# Patient Record
Sex: Female | Born: 1995
Health system: Southern US, Community
[De-identification: ages and names within clinical notes are randomized; demographics above are authoritative.]

---
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
## 2019-07-29 ENCOUNTER — Ambulatory Visit: Payer: Self-pay | Attending: Family

## 2019-07-29 DIAGNOSIS — Z23 Encounter for immunization: Secondary | ICD-10-CM

## 2019-07-29 NOTE — Progress Notes (Signed)
   Covid-19 Vaccination [facility]  Name:  Vanessa Green    MRN: [id number] DOB: 02-25-1996  07/29/2019  [name] was observed post Covid-19 immunization for 15 minutes without incident. She was provided with Vaccine Information Sheet and instruction to access the V-Safe system.   [name] was instructed to call 911 with any severe reactions post vaccine: Marland Kitchen Difficulty breathing  . Swelling of face and throat  . A fast heartbeat  . A bad rash all over body  . Dizziness and weakness   Immunizations Administered    Name Date Dose VIS Date Route   Moderna COVID-19 Vaccine 07/29/2019  1:12 PM 0.5 mL 04/13/2019 Intramuscular   Manufacturer: Moderna   Lot: [id number]   NDC: 10301-314-38

## 2019-08-31 ENCOUNTER — Ambulatory Visit: Payer: Self-pay | Attending: Family

## 2019-08-31 DIAGNOSIS — Z23 Encounter for immunization: Secondary | ICD-10-CM

## 2019-08-31 NOTE — Progress Notes (Signed)
   Covid-19 Vaccination Clinic  Name:  Vanessa Green    MRN: [id number] DOB: 1995/07/01  08/31/2019  [name] was observed post Covid-19 immunization for 15 minutes without incident. She was provided with Vaccine Information Sheet and instruction to access the V-Safe system.   [name] was instructed to call 911 with any severe reactions post vaccine: Marland Kitchen Difficulty breathing  . Swelling of face and throat  . A fast heartbeat  . A bad rash all over body  . Dizziness and weakness   Immunizations Administered    Name Date Dose VIS Date Route   Moderna COVID-19 Vaccine 08/31/2019  1:03 PM 0.5 mL 04/2019 Intramuscular   Manufacturer: Moderna   Lot: [id number]   NDC: 01586-825-74
